# Patient Record
Sex: Male | Born: 1965 | Race: White | Hispanic: No | Marital: Married | Smoking: Never smoker
Health system: Southern US, Community
[De-identification: ages and names within clinical notes are randomized; demographics above are authoritative.]

## PROBLEM LIST (undated history)

## (undated) DIAGNOSIS — I10 Essential (primary) hypertension: Secondary | ICD-10-CM

## (undated) HISTORY — PX: LEG SURGERY: SHX1003

## (undated) HISTORY — PX: HERNIA REPAIR: SHX51

## (undated) HISTORY — PX: CARPAL TUNNEL RELEASE: SHX101

---

## 2019-05-13 ENCOUNTER — Encounter (HOSPITAL_BASED_OUTPATIENT_CLINIC_OR_DEPARTMENT_OTHER): Payer: Self-pay

## 2019-05-13 ENCOUNTER — Emergency Department (HOSPITAL_BASED_OUTPATIENT_CLINIC_OR_DEPARTMENT_OTHER)
Admission: EM | Admit: 2019-05-13 | Discharge: 2019-05-13 | Disposition: A | Discharge status: Home / Self Care | Payer: Worker's Compensation | Attending: Emergency Medicine | Admitting: Emergency Medicine

## 2019-05-13 ENCOUNTER — Other Ambulatory Visit: Payer: Self-pay

## 2019-05-13 ENCOUNTER — Emergency Department (HOSPITAL_BASED_OUTPATIENT_CLINIC_OR_DEPARTMENT_OTHER): Payer: Worker's Compensation

## 2019-05-13 DIAGNOSIS — W312XXA Contact with powered woodworking and forming machines, initial encounter: Secondary | ICD-10-CM | POA: Insufficient documentation

## 2019-05-13 DIAGNOSIS — Y999 Unspecified external cause status: Secondary | ICD-10-CM | POA: Insufficient documentation

## 2019-05-13 DIAGNOSIS — S30811A Abrasion of abdominal wall, initial encounter: Secondary | ICD-10-CM | POA: Diagnosis not present

## 2019-05-13 DIAGNOSIS — Z23 Encounter for immunization: Secondary | ICD-10-CM | POA: Diagnosis not present

## 2019-05-13 DIAGNOSIS — Y929 Unspecified place or not applicable: Secondary | ICD-10-CM | POA: Insufficient documentation

## 2019-05-13 DIAGNOSIS — S61412A Laceration without foreign body of left hand, initial encounter: Secondary | ICD-10-CM | POA: Insufficient documentation

## 2019-05-13 DIAGNOSIS — Y9389 Activity, other specified: Secondary | ICD-10-CM | POA: Diagnosis not present

## 2019-05-13 HISTORY — DX: Essential (primary) hypertension: I10

## 2019-05-13 MED ORDER — CEPHALEXIN 500 MG PO CAPS: 500.0000 mg | ORAL_CAPSULE | Freq: Two times a day (BID) | ORAL | 0 refills | Status: AC

## 2019-05-13 MED ORDER — LIDOCAINE HCL 2 % IJ SOLN
10.0000 mL | Freq: Once | INTRAMUSCULAR | Status: AC
  Administered 2019-05-13: 10 mL via INTRADERMAL
  Filled 2019-05-13: qty 20

## 2019-05-13 MED ORDER — TETANUS-DIPHTH-ACELL PERTUSSIS 5-2.5-18.5 LF-MCG/0.5 IM SUSP
0.5000 mL | Freq: Once | INTRAMUSCULAR | Status: AC
  Administered 2019-05-13: 0.5 mL via INTRAMUSCULAR
  Filled 2019-05-13: qty 0.5

## 2019-05-13 NOTE — ED Provider Notes (Signed)
[facility] EMERGENCY DEPARTMENT Provider Note   CSN: [id number] Arrival date & time: 05/13/19  1422     History   Chief Complaint Chief Complaint  Patient presents with  . Trauma    HPI Jonathan Townsend is a 53 y.o. male with history of hypertension presents for evaluation of acute onset, persistent wound to the palmar aspect of the left hand.  He reports that at around 1 PM today he was using a table saw on a block of wood when the wood kicked back, colliding with his upper adbomen, and his left hand slipped into the saw.  He has an abrasion to his abdomen but denies any abdominal pain, nausea, or vomiting.  He has a laceration to the thenar eminence of the left hand, bleeding controlled.  He went to occupational health where a dressing was applied and he was sent to the ED for evaluation.  He notes some numbness and tingling to his fingertip of his left thumb from the DIP distally.  No weakness.  He takes a baby aspirin daily but otherwise no anticoagulation.  Thinks his last tetanus shot was around 5 years ago but is unsure.  He is right-hand dominant.     The history is provided by the patient.    Past Medical History:  Diagnosis Date  . Hypertension     There are no active problems to display for this patient.   Past Surgical History:  Procedure Laterality Date  . CARPAL TUNNEL RELEASE    . HERNIA REPAIR    . LEG SURGERY          Home Medications    Prior to Admission medications   Medication Sig Start Date End Date Taking? Authorizing Provider  cephALEXin (KEFLEX) 500 MG capsule Take 1 capsule (500 mg total) by mouth 2 (two) times daily for 5 days. 05/13/19 05/18/19  Jeanie SewerFawze, Easton Fetty A, PA-C    Family History No family history on file.  Social History Social History   Tobacco Use  . Smoking status: Never Smoker  . Smokeless tobacco: Never Used  Substance Use Topics  . Alcohol use: Never    Frequency: Never  . Drug use: Never     Allergies   Lipitor  [atorvastatin calcium]   Review of Systems Review of Systems  Skin: Positive for wound.  Neurological: Positive for numbness. Negative for weakness.  All other systems reviewed and are negative.    Physical Exam Updated Vital Signs BP (!) 161/91 (BP Location: Right Arm)   Pulse 78   Temp 99.4 F (37.4 C) (Oral)   Resp 20   Ht 5\' 11"  (1.803 m)   Wt (!) 151 kg   SpO2 98%   BMI 46.44 kg/m   Physical Exam Vitals signs and nursing note reviewed.  Constitutional:      General: He is not in acute distress.    Appearance: He is well-developed.  HENT:     Head: Normocephalic and atraumatic.  Eyes:     General:        Right eye: No discharge.        Left eye: No discharge.     Conjunctiva/sclera: Conjunctivae normal.  Neck:     Vascular: No JVD.     Trachea: No tracheal deviation.  Cardiovascular:     Rate and Rhythm: Normal rate.     Pulses: Normal pulses.     Comments: 2+ radial pulses bilaterally Pulmonary:     Effort: Pulmonary effort is  normal.  Abdominal:     General: There is no distension.     Palpations: Abdomen is soft.     Tenderness: There is no abdominal tenderness. There is no guarding or rebound.     Comments: Superficial abrasion to the mid abdomen, no tenderness to palpation or ecchymosis.  Musculoskeletal:     Comments: See below image.  Patient has a 6 cm laceration to the thenar eminence of the left hand extending into the left thumb.  Laceration is more superficial distally.  Normal active range of motion of the left thumb, no restriction with thumb apposition.  5/5 strength of wrist and digits with flexion extension against resistance.  Good grip strength bilaterally.  Bleeding is controlled.  Skin:    General: Skin is warm and dry.     Findings: No erythema.  Neurological:     Mental Status: He is alert.     Comments: Slightly altered sensation to light touch of the left thumb distal fingertip but otherwise sensation intact to light touch of  bilateral hands.  Psychiatric:        Behavior: Behavior normal.        ED Treatments / Results  Labs (all labs ordered are listed, but only abnormal results are displayed) Labs Reviewed - No data to display  EKG None  Radiology Dg Hand Complete Left  Result Date: 05/13/2019 CLINICAL DATA:  Left thumb laceration, open EXAM: LEFT HAND - COMPLETE 3+ VIEW COMPARISON:  None. FINDINGS: No fracture or dislocation of the left hand. Bandage material about the left thumb. No radiopaque foreign body. 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 IMPRESSION: No fracture or dislocation of the left hand. Bandage material about the left thumb. No radiopaque foreign body. Electronically Signed   By: Lauralyn Primes M.D.   On: 05/13/2019 15:00    Procedures .Marland KitchenLaceration Repair  Date/Time: 05/13/2019 6:17 PM Performed by: Jeanie Sewer, PA-C Authorized by: Jeanie Sewer, PA-C   Consent:    Consent obtained:  Verbal   Consent given by:  Patient   Risks discussed:  Infection, need for additional repair, pain, poor cosmetic result and poor wound healing   Alternatives discussed:  No treatment and delayed treatment Universal protocol:    Procedure explained and questions answered to patient or proxy's satisfaction: yes     Relevant documents present and verified: yes     Test results available and properly labeled: yes     Imaging studies available: yes     Required blood products, implants, devices, and special equipment available: yes     Site/side marked: yes     Immediately prior to procedure, a time out was called: yes     Patient identity confirmed:  Verbally with patient Anesthesia (see MAR for exact dosages):    Anesthesia method:  Local infiltration   Local anesthetic:  Lidocaine 2% w/o epi Laceration details:    Location:  Hand   Hand location:  L palm   Length (cm):  6   Depth (mm):  4 Repair type:     Repair type:  Intermediate Pre-procedure details:    Preparation:  Imaging obtained to evaluate for foreign bodies and patient was prepped and draped in usual sterile fashion Exploration:    Hemostasis achieved with:  Direct pressure   Wound exploration: wound explored through full range of motion     Wound extent: areolar tissue violated     Wound extent:  no foreign bodies/material noted and no underlying fracture noted     Contaminated: yes   Treatment:    Area cleansed with:  Betadine and saline   Amount of cleaning:  Extensive   Irrigation solution:  Sterile saline   Irrigation method:  Pressure wash   Visualized foreign bodies/material removed: no   Subcutaneous repair:    Suture size:  4-0   Suture material:  Vicryl   Suture technique:  Simple interrupted   Number of sutures:  2 Skin repair:    Repair method:  Sutures   Suture size:  4-0   Suture material:  Prolene   Suture technique:  Simple interrupted   Number of sutures:  11 Post-procedure details:    Dressing:  Antibiotic ointment and sterile dressing   Patient tolerance of procedure:  Tolerated well, no immediate complications   (including critical care time)  Medications Ordered in ED Medications  lidocaine (XYLOCAINE) 2 % (with pres) injection 200 mg (10 mLs Intradermal Given 05/13/19 1626)  Tdap (BOOSTRIX) injection 0.5 mL (0.5 mLs Intramuscular Given 05/13/19 1626)     Initial Impression / Assessment and Plan / ED Course  I have reviewed the triage vital signs and the nursing notes.  Pertinent labs & imaging results that were available during my care of the patient were reviewed by me and considered in my medical decision making (see chart for details).        Patient presenting for evaluation of laceration to the left hand.  He is afebrile, initially hypertensive but overall well-appearing.  Vital signs otherwise stable.  He is neurovascularly intact but has some paresthesias to the left distal fingertip  of the thumb which does not appear to follow any specific nerve distribution.  With regards to his abdominal abrasion he has no abdominal tenderness and I have a low suspicion of acute traumatic or surgical intra-abdominal pathology.  Pressure irrigation performed. Wound explored and base of wound visualized in a bloodless field without evidence of foreign body.  The base of the wound was probed after anesthesia was achieved with no evidence of tendon or nerve injury.  Laceration occurred < 8 hours prior to repair which was well tolerated.  Tdap updated.  Pt has no comorbidities to effect normal wound healing.  Patient discharged with course of Keflex for wound prophylaxis.  Discussed suture home care with patient and answered questions. Pt to follow-up for wound check and suture removal in 7 days.  Will give referral to hand surgery if he has any long-term issues.  Discussed indications to return to the ED sooner. Pt verbalized understanding of and agreement with plan and is safe for discharge home at this time.    Final Clinical Impressions(s) / ED Diagnoses   Final diagnoses:  Laceration of left hand without foreign body, initial encounter    ED Discharge Orders         Ordered    cephALEXin (KEFLEX) 500 MG capsule  2 times daily     05/13/19 1822           Debroah Baller 05/13/19 Samella Parr, MD 05/13/19 [id number]

## 2019-05-13 NOTE — ED Triage Notes (Addendum)
Pt with WC injury ~130pm-had kick back of wood to abd and cut left hand on saw-was seen at [facility] where hand was dressed and post accident UDS done and sent to ED-NAD-steady gait

## 2019-05-13 NOTE — Discharge Instructions (Signed)
1. Medications: Please take all of your antibiotics until finished!   You may develop abdominal discomfort or diarrhea from the antibiotic.  You may help offset this with probiotics which you can buy or get in yogurt. Do not eat  or take the probiotics until 2 hours after your antibiotic.   You can take 1 to 2 tablets of Tylenol (350mg -1000mg  depending on the dose) every 6 hours as needed for pain.  Do not exceed 4000 mg of Tylenol daily.  If your pain persists you can take a doses of ibuprofen in between doses of Tylenol.  I usually recommend 400 to 600 mg of ibuprofen every 6 hours.  Take this with food to avoid upset stomach issues.  2. Treatment: ice for swelling, keep wound clean with warm soap and water and keep bandage dry, do not submerge in water for 24 hours 3. Follow Up: Please return in 7 days to have your stitches/staples removed or sooner if you have concerns.  You may also go to urgent care or your primary care physician.  Return to the emergency department sooner if any concerning signs or symptoms develop such as high fevers, redness, drainage of pus from the wound, or swelling.   WOUND CARE  Keep area clean and dry for 24 hours. Do not remove bandage, if applied.  After 24 hours, remove bandage and wash wound gently with mild soap and warm water. Reapply a new bandage after cleaning wound, if directed.   Continue daily cleansing with soap and water until stitches/staples are removed.  Do not apply any ointments or creams to the wound while stitches/staples are in place, as this may cause delayed healing. Return if you experience any of the following signs of infection: Swelling, redness, pus drainage, streaking, fever >101.0 F  Return if you experience excessive bleeding that does not stop after 15-20 minutes of constant, firm pressure.

## 2021-08-12 ENCOUNTER — Other Ambulatory Visit: Payer: Self-pay | Admitting: Orthopedic Surgery

## 2021-08-12 DIAGNOSIS — M545 Low back pain, unspecified: Secondary | ICD-10-CM

## 2021-09-04 ENCOUNTER — Other Ambulatory Visit: Payer: Self-pay

## 2021-09-04 ENCOUNTER — Ambulatory Visit
Admission: RE | Admit: 2021-09-04 | Discharge: 2021-09-04 | Disposition: A | Discharge status: Home / Self Care | Payer: Self-pay | Source: Ambulatory Visit | Attending: Orthopedic Surgery | Admitting: Orthopedic Surgery

## 2021-09-04 DIAGNOSIS — M545 Low back pain, unspecified: Secondary | ICD-10-CM

## 2022-05-20 IMAGING — MR MR LUMBAR SPINE W/O CM
4 of 5 series · 26 of 48 positions shown · non-contrast
Comparison: None.

CLINICAL DATA: Low back pain, unspecified back pain laterality,
unspecified chronicity, unspecified whether sciatica present
(0L1-S4-CM)

EXAM:
MRI LUMBAR SPINE WITHOUT CONTRAST
TECHNIQUE: Multiplanar, multisequence MR imaging of the lumbar spine was
performed. No intravenous contrast was administered.

[Series 3: T2 · sagittal · 4.0mm · 0.53mm/px · 6 of 15 slices shown (1 of 2)]
[im 1/15]
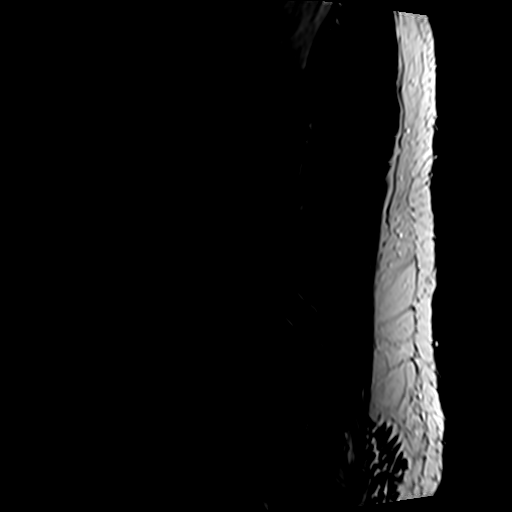
[im 3/15]
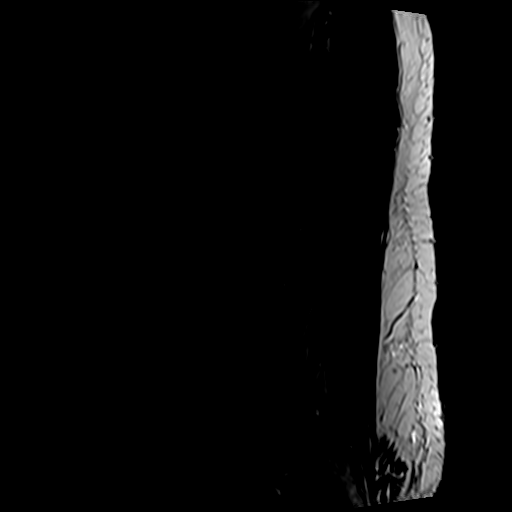
[im 6/15]
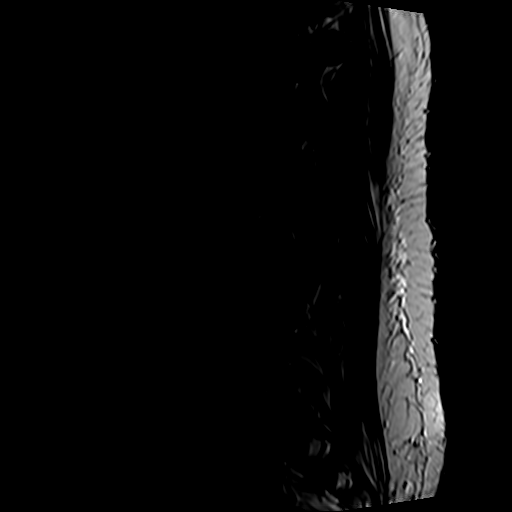
[im 9/15]
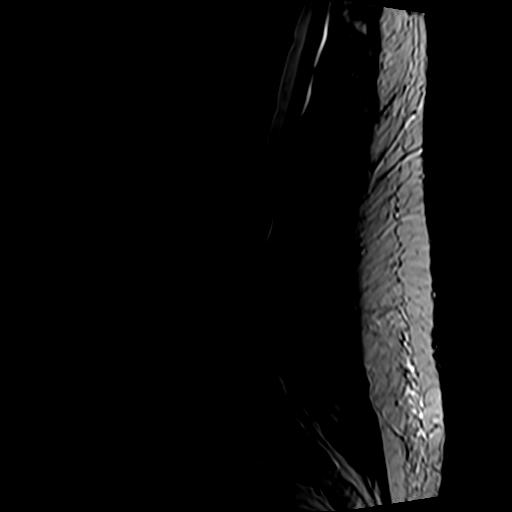
[im 12/15]
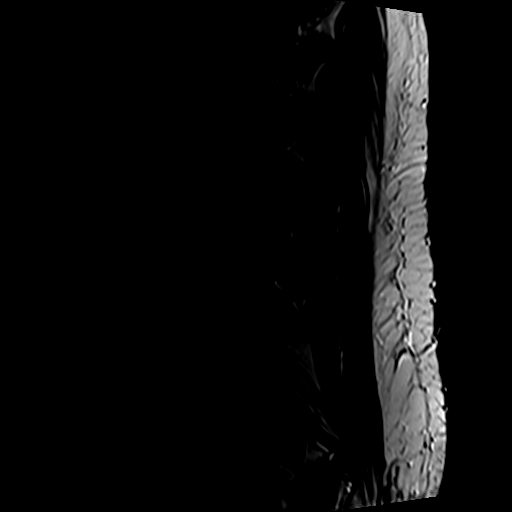
[im 15/15]
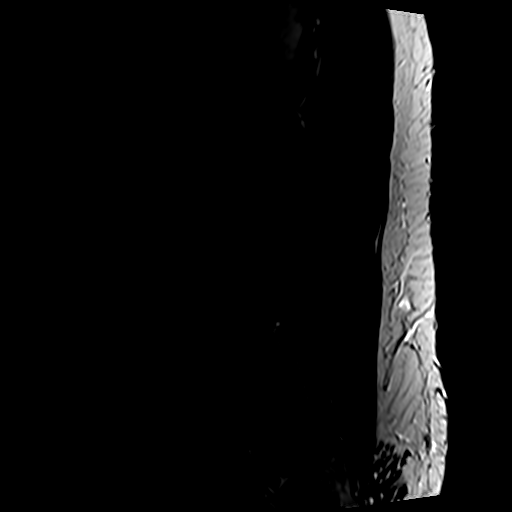

[Series 5: T1 · sagittal · 4.0mm · 0.53mm/px · 6 of 15 slices shown (1 of 2)]
[im 1/15]
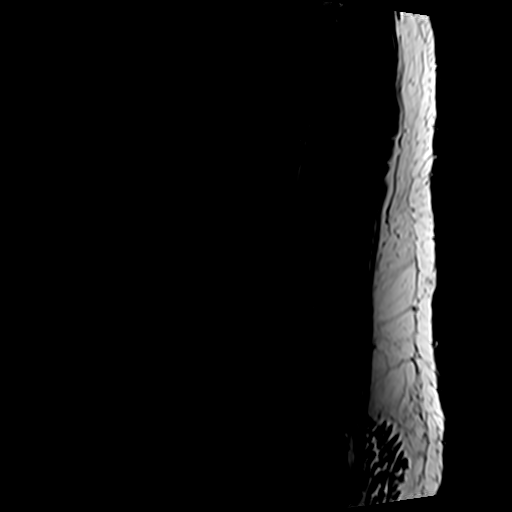
[im 3/15]
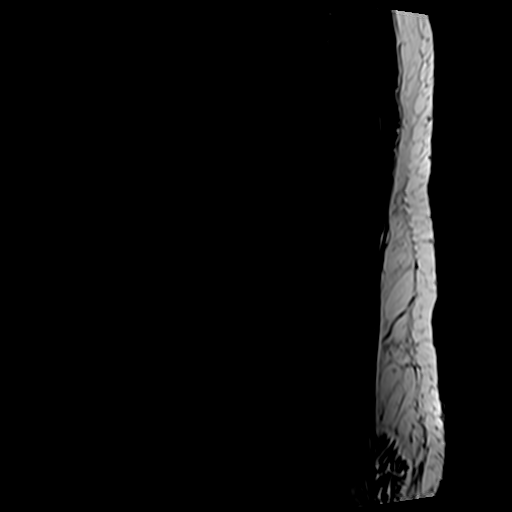
[im 6/15]
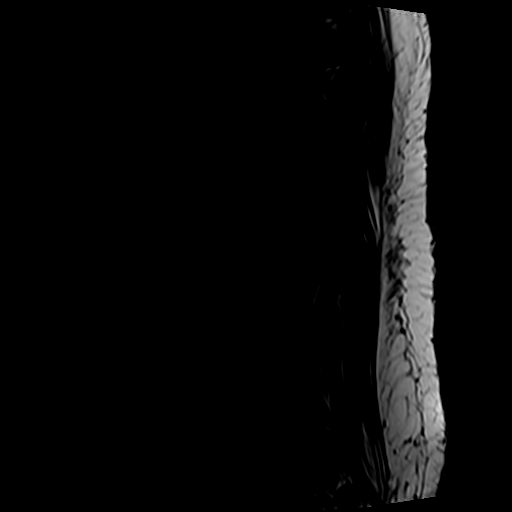
[im 9/15]
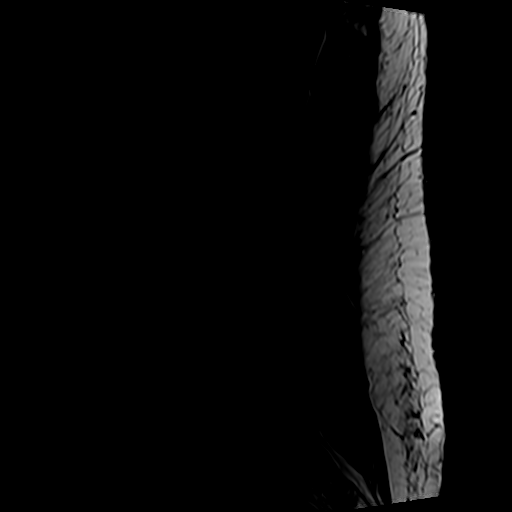
[im 12/15]
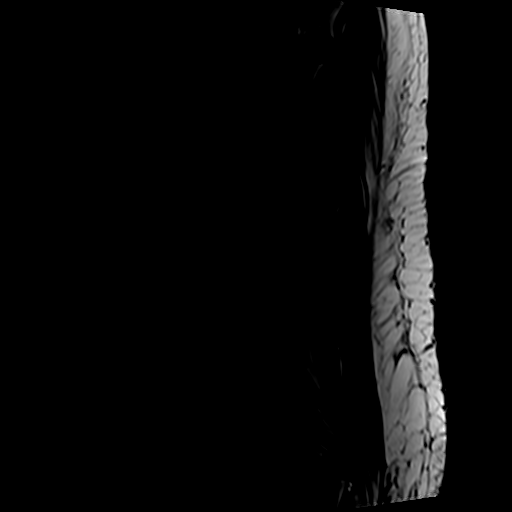
[im 15/15]
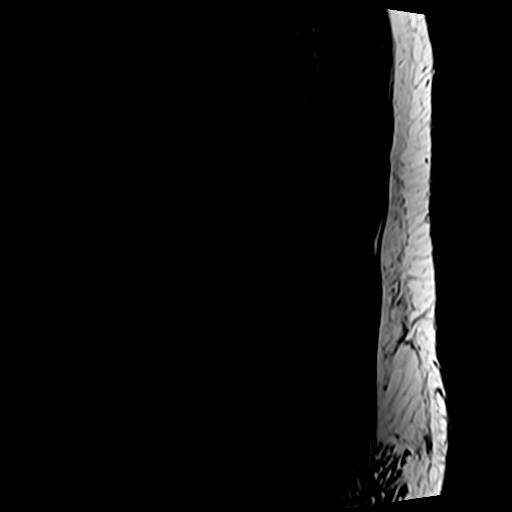

[Series 6: T2 · axial · 4.0mm · 0.70mm/px · z∈[-179,+28]mm · 9 of 36 slices shown (2 of 2)]
[im 1/36]
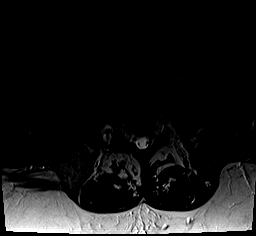
[im 6/36]
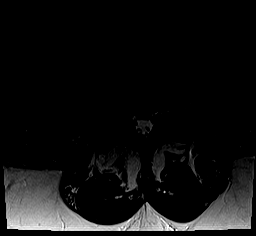
[im 11/36]
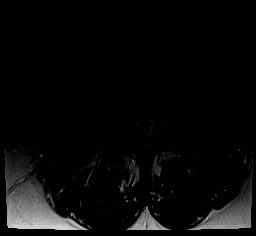
[im 16/36]
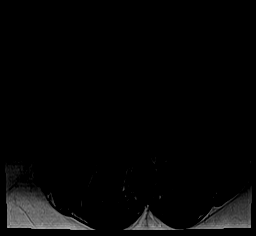
[im 18/36]
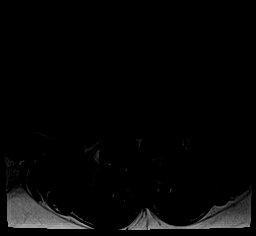
[im 21/36]
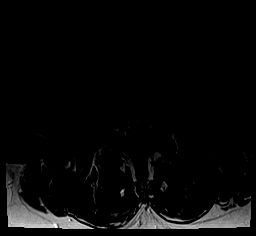
[im 26/36]
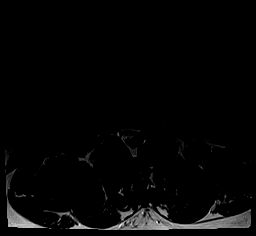
[im 31/36]
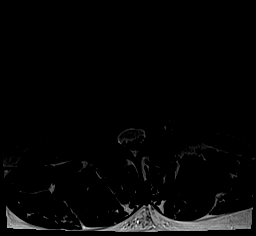
[im 36/36]
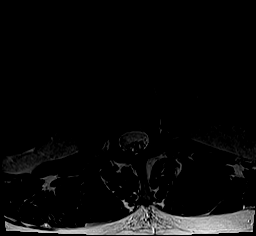

[Series 7: T1 · axial · 4.0mm · 0.35mm/px · z∈[-178,+2]mm · 5 of 36 slices shown (2 of 2)]
[im 1/36]
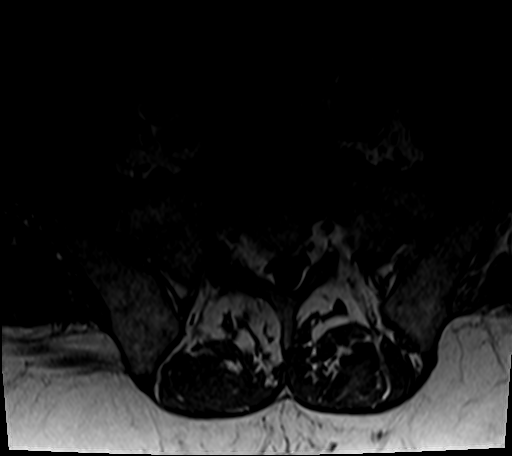
[im 6/36]
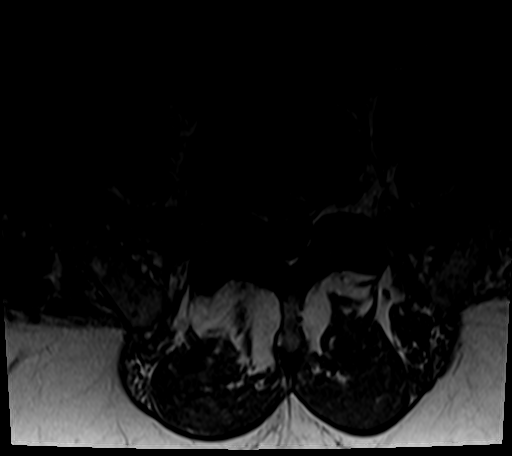
[im 11/36]
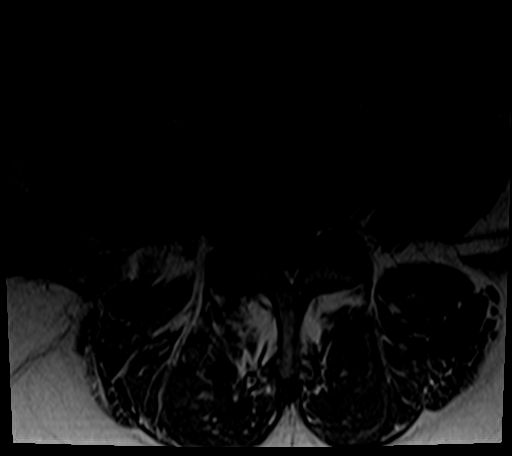
[im 18/36]
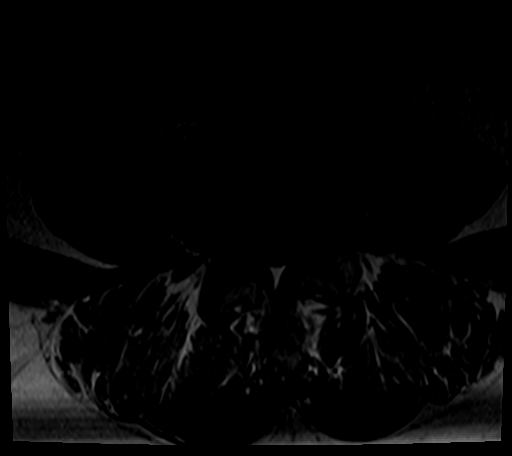
[im 31/36]
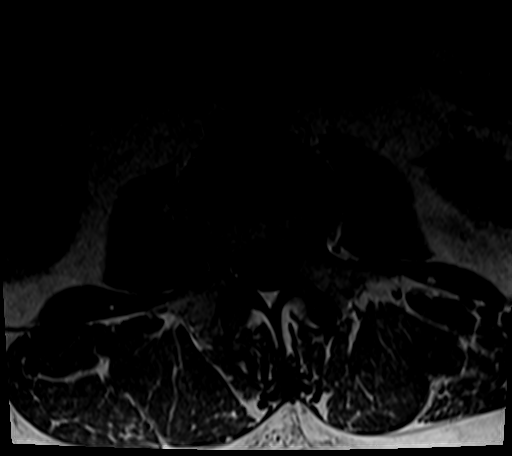

[26 of 48 positions shown; findings below may reference images not displayed]

FINDINGS: Segmentation:  Standard.

Alignment:  Physiologic.

Vertebrae:  No fracture, evidence of discitis, or bone lesion.

Conus medullaris and cauda equina: Conus extends to the L1 level.
Conus and cauda equina appear normal.

Paraspinal and other soft tissues: Negative.

Disc levels:

L1-L2: Normal disc space and facet joints. No spinal canal stenosis.
No neural foraminal stenosis.

L2-L3: Small left subarticular disc protrusion. No spinal canal
stenosis. No neural foraminal stenosis.

L3-L4: Medium-sized disc bulge, right asymmetric with superimposed
small central disc extrusion with inferior migration. Moderate
spinal canal stenosis. Moderate right neural foraminal stenosis.

L4-L5: Right asymmetric disc bulge. Right lateral recess narrowing
without central spinal canal stenosis. Severe right neural foraminal
stenosis.

L5-S1: Moderate facet hypertrophy with small central disc
protrusion. No spinal canal stenosis. No neural foraminal stenosis.

Visualized sacrum: Normal.
IMPRESSION: 1. Moderate spinal canal stenosis at L3-L4 due to combination of
disc bulge and superimposed central disc extrusion with inferior
migration.
2. Right lateral recess narrowing and severe right neural foraminal
stenosis at L4-L5 secondary to right asymmetric disc bulge.
3. Moderate facet hypertrophy at L5-S1.

## 2022-07-25 ENCOUNTER — Other Ambulatory Visit: Payer: Self-pay | Admitting: Orthopedic Surgery

## 2022-07-25 DIAGNOSIS — M25552 Pain in left hip: Secondary | ICD-10-CM

## 2022-08-14 ENCOUNTER — Ambulatory Visit
Admission: RE | Admit: 2022-08-14 | Discharge: 2022-08-14 | Disposition: A | Discharge status: Home / Self Care | Payer: BC Managed Care – PPO | Source: Ambulatory Visit | Attending: Orthopedic Surgery | Admitting: Orthopedic Surgery

## 2022-08-14 DIAGNOSIS — M25552 Pain in left hip: Secondary | ICD-10-CM
